# Patient Record
Sex: Male | Born: 1990
Health system: Southern US, Community
[De-identification: ages and names within clinical notes are randomized; demographics above are authoritative.]

## PROBLEM LIST (undated history)

## (undated) DIAGNOSIS — F909 Attention-deficit hyperactivity disorder, unspecified type: Secondary | ICD-10-CM

## (undated) DIAGNOSIS — K219 Gastro-esophageal reflux disease without esophagitis: Secondary | ICD-10-CM

## (undated) DIAGNOSIS — J358 Other chronic diseases of tonsils and adenoids: Secondary | ICD-10-CM

## (undated) DIAGNOSIS — J45909 Unspecified asthma, uncomplicated: Secondary | ICD-10-CM

## (undated) HISTORY — PX: WISDOM TOOTH EXTRACTION: SHX21

## (undated) HISTORY — DX: Unspecified asthma, uncomplicated: J45.909

---
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
## 2012-12-07 ENCOUNTER — Encounter: Payer: Self-pay | Admitting: Gastroenterology

## 2012-12-14 ENCOUNTER — Ambulatory Visit (INDEPENDENT_AMBULATORY_CARE_PROVIDER_SITE_OTHER): Payer: 59 | Admitting: Gastroenterology

## 2012-12-14 ENCOUNTER — Encounter: Payer: Self-pay | Admitting: Gastroenterology

## 2012-12-14 VITALS — BP 110/70 | HR 60 | Ht 68.5 in | Wt 161.8 lb

## 2012-12-14 DIAGNOSIS — R197 Diarrhea, unspecified: Secondary | ICD-10-CM

## 2012-12-14 MED ORDER — METRONIDAZOLE 250 MG PO TABS: 250.0000 mg | ORAL_TABLET | Freq: Three times a day (TID) | ORAL | Status: DC

## 2012-12-14 NOTE — Progress Notes (Signed)
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  He has increased mucus in his stool but generally no heme, abdominal pain, but does have some gas, bloating, and postprandial nausea.  He has had exposure to mountain water in December when he was camping out, and drank from a mountain stream, Temporally this was before his diarrhea started. He also has 2 [place] dogs.  He's been seen by is primary care physician,and had negative stool exams for ova and parasites and negative stool culture and normal laboratory parameters except for a slightly increased bilirubin of 1.2 mg percent.  Stool was negative for blood on rectal examination earlier this month.  There no sick family members at home, no family history of inflammatory bowel disease, and he denies gastrointestinal problems as a child.  He currently is in school at [employer] ., and denies stress or any psychiatric difficulties.  He uses alcohol socially but denies alcohol abuse, and does not smoke or use NSAIDs.  He said no major changes in his diet he has no symptoms of chronic malabsorption.  I have reviewed this patient's present history, medical and surgical past history, allergies and medications.     ROS:   All systems were reviewed and are negative unless otherwise stated in the HPI.  No Known Allergies No outpatient prescriptions prior to visit.   No facility-administered medications prior to visit.   Past Medical History  Diagnosis Date  . Asthma    Past Surgical History  Procedure Laterality Date  . Mole removal     History   Social History  . Marital Status: Single    Spouse Name: N/A    Number of Children: N/A  . Years of Education: N/A   Occupational History  . MINOR     Student [employer]   Social History Main Topics  . Smoking status: Never Smoker   . Smokeless tobacco: Never Used  .  Alcohol Use: Yes  . Drug Use: No  . Sexually Active: None   Other Topics Concern  . None   Social History Narrative  . None   Family History  Problem Relation Age of Onset  . Pancreatic cancer Maternal Grandfather   . Hypertension      Maternal side       Physical Exam: LT appearing patient in no distress.  Blood pressure 110/70, pulse 60 and regular and weight 161 with a BMI of 24.24. General well developed well nourished patient in no acute distress, appearing their stated age Eyes PERRLA, no icterus, fundoscopic exam per opthamologist Skin no lesions noted Neck supple, no adenopathy, no thyroid enlargement, no tenderness Chest clear to percussion and auscultation Heart no significant murmurs, gallops or rubs noted Abdomen no hepatosplenomegaly masses or tenderness, BS normal.  Extremities no acute joint lesions, edema, phlebitis or evidence of cellulitis. Neurologic patient oriented x 3, cranial nerves intact, no focal neurologic deficits noted. Psychological mental status normal and normal affect.  Assessment and plan: Despite a negative stool ova and parasite exam, I suspect this patient had giardiasis related to his procedure to mountain water in early December.  Giardia can be extremely difficult to detect on a single stool O&P exam.  It certainly does not sound like this patient has inflammatory bowel disease or malabsorption.  I have decided to treat him with metronidazole 250 mg 3 times a  day for 10 days with a somewhat low residue diet without major lactose products or nonabsorbable carbohydrates.  He is to call in 7-10 days time for progress report.  Further evaluation and see if he has not responded to the above treatment protocol.  Please copy her primary care physician, referring physician, and pertinent subspecialists.  No diagnosis found.

## 2012-12-14 NOTE — Patient Instructions (Signed)
  We have sent the following medications to your pharmacy for you to pick up at your convenience: Flagyl 250 mg, please take one tablet by mouth three times daily for ten days  Please call Dr. Norval Gable nurse Aram Beecham when you finish with your antibiotic to give a report on how you feel.  Information on Artificial Sweeteners given today for your review. ____________________________________________                                               We are excited to introduce MyChart, a new best-in-class service that provides you online access to important information in your electronic medical record. We want to make it easier for you to view your health information - all in one secure location - when and where you need it. We expect MyChart will enhance the quality of care and service we provide.  When you register for MyChart, you can:    View your test results.    Request appointments and receive appointment reminders via email.    Request medication renewals.    View your medical history, allergies, medications and immunizations.    Communicate with your physician's office through a password-protected site.    Conveniently print information such as your medication lists.  To find out if MyChart is right for you, please talk to a member of our clinical staff today. We will gladly answer your questions about this free health and wellness tool.  If you are age 20 or older and want a member of your family to have access to your record, you must provide written consent by completing a proxy form available at our office. Please speak to our clinical staff about guidelines regarding accounts for patients younger than age 20.  As you activate your MyChart account and need any technical assistance, please call the MyChart technical support line at [phone]) or email your question to [email or web address]. If you email your question(s), please include your name, a return  phone number and the best time to reach you.  If you have non-urgent health-related questions, you can send a message to our office through MyChart at [email or web address]. If you have a medical emergency, call 911.  Thank you for using MyChart as your new health and wellness resource!   MyChart licensed from [employer],  4540-9811. Patents Pending.

## 2013-01-03 ENCOUNTER — Telehealth: Payer: Self-pay | Admitting: Gastroenterology

## 2013-01-04 ENCOUNTER — Encounter (HOSPITAL_COMMUNITY): Payer: Self-pay | Admitting: Emergency Medicine

## 2013-01-04 ENCOUNTER — Emergency Department (HOSPITAL_COMMUNITY)
Admission: EM | Admit: 2013-01-04 | Discharge: 2013-01-04 | Disposition: A | Discharge status: Home / Self Care | Payer: 59 | Source: Home / Self Care

## 2013-01-04 DIAGNOSIS — L739 Follicular disorder, unspecified: Secondary | ICD-10-CM

## 2013-01-04 DIAGNOSIS — L738 Other specified follicular disorders: Secondary | ICD-10-CM

## 2013-01-04 MED ORDER — DOXYCYCLINE HYCLATE 100 MG PO CAPS: 100.0000 mg | ORAL_CAPSULE | Freq: Two times a day (BID) | ORAL | Status: DC

## 2013-01-04 NOTE — ED Notes (Signed)
C/o skin rash on right thigh leg.  Patient states the rash came up after he went horse back riding on last Wednesday.  Patient states he did have itching, and drainage.  Has used Bactroban.

## 2013-01-04 NOTE — Telephone Encounter (Signed)
lmom for pt to call back. 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 His call note states his diarrhea is better, but he continues to have some diarrhea and his tools have not returned to normal.

## 2013-01-04 NOTE — ED Provider Notes (Signed)
Peter Mckee is a 22 y.o. male who presents to Urgent Care today for right posterior leg rash occurring one week ago. The rash occurred one day following a horseback riding session where he developed abrasions on the back of both legs. It was initially erythematous and painful with several pustules. He has been treated with antibiotic ointment and it is resolving. He no longer notes any pain or discharge. He denies any fevers or chills and feels well otherwise. He denies any radiating pain weakness or numbness. No mass or vomiting or diarrhea.   PMH reviewed. Healthy otherwise History  Substance Use Topics  . Smoking status: Never Smoker   . Smokeless tobacco: Never Used  . Alcohol Use: Yes   ROS as above Medications reviewed. No current facility-administered medications for this encounter.   Current Outpatient Prescriptions  Medication Sig Dispense Refill  . doxycycline (VIBRAMYCIN) 100 MG capsule Take 1 capsule (100 mg total) by mouth 2 (two) times daily.  20 capsule  0  . Multiple Vitamin (MULTIVITAMIN) tablet Take 1 tablet by mouth daily.      . vitamin C (ASCORBIC ACID) 500 MG tablet Take 500 mg by mouth daily.        Exam:  BP 131/79  Pulse 60  Temp(Src) 98.5 F (36.9 C) (Oral)  Resp 18  SpO2 100% Gen: Well NAD Skin: Right posterior thigh. Scattered erythematous macules with occasional pustules in the area approximately 3 cm in diameter. Surrounding this area is small healing abrasions. He has similar abrasions on the other side.  Nontender no scale or discharge.     Assessment and Plan: 22 y.o. male with resolving folliculitis.  Currently being well treated with topical antibiotic ointment.  Plan to augment with oral doxycycline if not improving. Discussed warning signs or symptoms. Please see discharge instructions. Patient expresses understanding.      Rodolph Bong, MD 01/04/13 1739

## 2013-01-05 MED ORDER — VSL#3 PO CAPS: 2.0000 | ORAL_CAPSULE | Freq: Every day | ORAL | Status: DC

## 2013-01-05 NOTE — Telephone Encounter (Signed)
VSl #3 for 1 month

## 2013-01-05 NOTE — Telephone Encounter (Signed)
Informed pt of the new script for VSL and he will keep the med refrigerated. Pt stated understanding.

## 2013-01-05 NOTE — Telephone Encounter (Signed)
lmom that we have samples of VSL #3 for him to take x 1 month. The samples will be in the refrigerator

## 2013-01-05 NOTE — Telephone Encounter (Signed)
Pt was to call with an update 7-10 days after his OV on 12/14/12. Pt reports he finished the Flagyl and he has improvement in the diarrhea. He is going less frequently and the consistency of his stools are better. Before he was having urgency and diarrhea every time he ate, so maybe 5 stools daily. Now he has 3 or less stools daily, there is no urgency, he is eating better and his stools are less loose and more formed. Any further orders/actions? Thanks.

## 2014-10-01 DIAGNOSIS — J358 Other chronic diseases of tonsils and adenoids: Secondary | ICD-10-CM

## 2014-10-01 HISTORY — DX: Other chronic diseases of tonsils and adenoids: J35.8

## 2014-10-03 ENCOUNTER — Encounter (HOSPITAL_BASED_OUTPATIENT_CLINIC_OR_DEPARTMENT_OTHER): Payer: Self-pay | Admitting: *Deleted

## 2014-10-04 ENCOUNTER — Ambulatory Visit: Payer: Self-pay | Admitting: Otolaryngology

## 2014-10-04 NOTE — H&P (Signed)
  Assessment  Tonsillar calculus (474.8) (J35.8). Discussed  Chronic tonsil calculus with inflammation. We discussed conservative measures including gargling and cleaning out the debris. Consider tonsillectomy if all else fails. A handout was provided about tonsillectomy. Contact us as needed. Reason For Visit  Swollen tonsils. HPI  Chronic intermittent sore throats and debris buildup in both tonsils, left sides typically worse. Sometimes it bleeds also. Otherwise healthy. Allergies  No Known Drug Allergies. Current Meds  Dextroamphetamine Sulfate CR 10 MG CPCR;; RPT Adderall TABS (Amphetamine-Dextroamphetamine);; RPT. Active Problems  Asthma   (493.90) (J45.909). PSH  Oral Surgery Tooth Extraction. Family Hx  Family history of osteoporosis: Grandmother (V17.81) (Z82.62) Family history of pancreatic cancer: Grandmother (V16.0) (Z80.0) No pertinent family history: Mother. Personal Hx  Caffeine use (V49.89) (F15.90); 1 cup daily Never smoker Non-smoker (V49.89) (Z78.9) Social alcohol use (Z78.9). ROS  Systemic: Not feeling tired (fatigue).  No fever, no night sweats, and no recent weight loss. Head: No headache. Eyes: No eye symptoms. Otolaryngeal: No hearing loss, no earache, no tinnitus, and no purulent nasal discharge.  No nasal passage blockage (stuffiness), no snoring, no sneezing, and no hoarseness.  Sore throat. Cardiovascular: No chest pain or discomfort  and no palpitations. Pulmonary: No dyspnea, no cough, and no wheezing. Gastrointestinal: No dysphagia  and no heartburn.  No nausea, no abdominal pain, and no melena.  No diarrhea. Genitourinary: No dysuria. Endocrine: No muscle weakness. Musculoskeletal: No calf muscle cramps, no arthralgias, and no soft tissue swelling. Neurological: No dizziness, no fainting, no tingling, and no numbness. Psychological: No anxiety  and no depression. Skin: No rash. 12 system ROS was obtained and reviewed on the Health Maintenance  form dated today.  Positive responses are shown above.  If the symptom is not checked, the patient has denied it. Vital Signs   Recorded by Skolimowski,Sharon on 03 Oct 2014 01:16 PM BP:120/72,  Height: 5 ft 10 in, Weight: 166 lb , BMI: 23.8 kg/m2,  BMI Calculated: 23.82 ,  BSA Calculated: 1.93. Physical Exam  APPEARANCE: Well developed, well nourished, in no acute distress.  Normal affect, in a pleasant mood.  Oriented to time, place and person. COMMUNICATION: Normal voice   HEAD & FACE:  No scars, lesions or masses of head and face.  Sinuses nontender to palpation.  Salivary glands without mass or tenderness.  Facial strength symmetric.  No facial lesion, scars, or mass. EYES: EOMI with normal primary gaze alignment. Visual acuity grossly intact.  PERRLA EXTERNAL EAR & NOSE: No scars, lesions or masses  EAC & TYMPANIC MEMBRANE:  EAC shows no obstructing lesions or debris and tympanic membranes are normal bilaterally with good movement to insufflation. GROSS HEARING: Normal   TMJ:  Nontender  INTRANASAL EXAM: No polyps or purulence.  NASOPHARYNX: Normal, without lesions. LIPS, TEETH & GUMS: No lip lesions, normal dentition and normal gums. ORAL CAVITY/OROPHARYNX:  Oral mucosa moist without lesion or asymmetry of the palate, tongue, tonsil or posterior pharynx, except for some erythema of the anterior pillar on the left and the tonsils were both deeply cryptic without any identifiable debris today. NECK:  Supple without adenopathy or mass. THYROID:  Normal with no masses palpable.  NEUROLOGIC:  No gross CN deficits. No nystagmus noted.   LYMPHATIC:  No enlarged nodes palpable. Signature  Electronically signed by : Genie Mirabal  M.D.; 10/03/2014 1:26 PM EST.  

## 2014-10-06 ENCOUNTER — Ambulatory Visit (HOSPITAL_BASED_OUTPATIENT_CLINIC_OR_DEPARTMENT_OTHER): Payer: 59 | Admitting: Certified Registered"

## 2014-10-06 ENCOUNTER — Encounter (HOSPITAL_BASED_OUTPATIENT_CLINIC_OR_DEPARTMENT_OTHER): Payer: Self-pay | Admitting: Certified Registered"

## 2014-10-06 ENCOUNTER — Ambulatory Visit (HOSPITAL_BASED_OUTPATIENT_CLINIC_OR_DEPARTMENT_OTHER)
Admission: RE | Admit: 2014-10-06 | Discharge: 2014-10-06 | Disposition: A | Discharge status: Home / Self Care | Payer: 59 | Source: Ambulatory Visit | Attending: Otolaryngology | Admitting: Otolaryngology

## 2014-10-06 ENCOUNTER — Encounter (HOSPITAL_BASED_OUTPATIENT_CLINIC_OR_DEPARTMENT_OTHER)
Admission: RE | Disposition: A | Discharge status: Home / Self Care | Payer: Self-pay | Source: Ambulatory Visit | Attending: Otolaryngology

## 2014-10-06 DIAGNOSIS — J358 Other chronic diseases of tonsils and adenoids: Secondary | ICD-10-CM | POA: Diagnosis not present

## 2014-10-06 DIAGNOSIS — J45909 Unspecified asthma, uncomplicated: Secondary | ICD-10-CM | POA: Diagnosis not present

## 2014-10-06 DIAGNOSIS — J3501 Chronic tonsillitis: Secondary | ICD-10-CM | POA: Diagnosis not present

## 2014-10-06 DIAGNOSIS — Z9089 Acquired absence of other organs: Secondary | ICD-10-CM

## 2014-10-06 DIAGNOSIS — Z79899 Other long term (current) drug therapy: Secondary | ICD-10-CM | POA: Diagnosis not present

## 2014-10-06 HISTORY — DX: Attention-deficit hyperactivity disorder, unspecified type: F90.9

## 2014-10-06 HISTORY — DX: Gastro-esophageal reflux disease without esophagitis: K21.9

## 2014-10-06 HISTORY — DX: Other chronic diseases of tonsils and adenoids: J35.8

## 2014-10-06 HISTORY — PX: TONSILLECTOMY: SHX5217

## 2014-10-06 LAB — POCT HEMOGLOBIN-HEMACUE: Hemoglobin: 12.5 g/dL — ABNORMAL LOW (ref 13.0–17.0)

## 2014-10-06 SURGERY — TONSILLECTOMY
Anesthesia: General | Site: Mouth | Laterality: Bilateral

## 2014-10-06 MED ORDER — BACITRACIN ZINC 500 UNIT/GM EX OINT: 1.0000 "application " | TOPICAL_OINTMENT | Freq: Three times a day (TID) | CUTANEOUS | Status: DC

## 2014-10-06 MED ORDER — ONDANSETRON HCL 4 MG/2ML IJ SOLN: 4.0000 mg | Freq: Once | INTRAMUSCULAR | Status: DC | PRN

## 2014-10-06 MED ORDER — AMPHETAMINE-DEXTROAMPHET ER 20 MG PO CP24: 20.0000 mg | ORAL_CAPSULE | Freq: Every day | ORAL | Status: DC

## 2014-10-06 MED ORDER — ALBUTEROL SULFATE HFA 108 (90 BASE) MCG/ACT IN AERS: 1.0000 | INHALATION_SPRAY | Freq: Four times a day (QID) | RESPIRATORY_TRACT | Status: DC | PRN

## 2014-10-06 MED ORDER — FENTANYL CITRATE (PF) 100 MCG/2ML IJ SOLN
INTRAMUSCULAR | Status: DC | PRN
Start: 2014-10-06 — End: 2014-10-06
  Administered 2014-10-06 (×2): 50 ug via INTRAVENOUS
  Administered 2014-10-06: 100 ug via INTRAVENOUS

## 2014-10-06 MED ORDER — FENTANYL CITRATE (PF) 100 MCG/2ML IJ SOLN: 50.0000 ug | INTRAMUSCULAR | Status: DC | PRN

## 2014-10-06 MED ORDER — FENTANYL CITRATE (PF) 100 MCG/2ML IJ SOLN
INTRAMUSCULAR | Status: AC
  Filled 2014-10-06: qty 4

## 2014-10-06 MED ORDER — HYDROMORPHONE HCL 1 MG/ML IJ SOLN
0.2500 mg | INTRAMUSCULAR | Status: DC | PRN
  Administered 2014-10-06 (×2): 0.5 mg via INTRAVENOUS

## 2014-10-06 MED ORDER — IBUPROFEN 100 MG/5ML PO SUSP
ORAL | Status: AC
  Filled 2014-10-06: qty 20

## 2014-10-06 MED ORDER — LIDOCAINE HCL (CARDIAC) 20 MG/ML IV SOLN
INTRAVENOUS | Status: DC | PRN
  Administered 2014-10-06: 100 mg via INTRAVENOUS

## 2014-10-06 MED ORDER — MEPERIDINE HCL 25 MG/ML IJ SOLN: 6.2500 mg | INTRAMUSCULAR | Status: DC | PRN

## 2014-10-06 MED ORDER — HYDROMORPHONE HCL 1 MG/ML IJ SOLN
INTRAMUSCULAR | Status: AC
  Filled 2014-10-06: qty 1

## 2014-10-06 MED ORDER — PROPOFOL 10 MG/ML IV BOLUS
INTRAVENOUS | Status: DC | PRN
  Administered 2014-10-06: 170 mg via INTRAVENOUS

## 2014-10-06 MED ORDER — LACTATED RINGERS IV SOLN
INTRAVENOUS | Status: DC
  Administered 2014-10-06 (×2): via INTRAVENOUS

## 2014-10-06 MED ORDER — MIDAZOLAM HCL 2 MG/ML PO SYRP: 0.5000 mg/kg | ORAL_SOLUTION | Freq: Once | ORAL | Status: DC | PRN

## 2014-10-06 MED ORDER — HYDROCODONE-ACETAMINOPHEN 7.5-325 MG/15ML PO SOLN: 15.0000 mL | Freq: Four times a day (QID) | ORAL | Status: AC | PRN

## 2014-10-06 MED ORDER — AMPHETAMINE-DEXTROAMPHETAMINE 10 MG PO TABS: 10.0000 mg | ORAL_TABLET | Freq: Every day | ORAL | Status: DC

## 2014-10-06 MED ORDER — ONDANSETRON HCL 4 MG/2ML IJ SOLN
INTRAMUSCULAR | Status: DC | PRN
  Administered 2014-10-06: 4 mg via INTRAVENOUS

## 2014-10-06 MED ORDER — PROMETHAZINE HCL 25 MG RE SUPP: 25.0000 mg | Freq: Four times a day (QID) | RECTAL | Status: DC | PRN

## 2014-10-06 MED ORDER — MIDAZOLAM HCL 2 MG/2ML IJ SOLN
1.0000 mg | INTRAMUSCULAR | Status: DC | PRN
  Administered 2014-10-06: 2 mg via INTRAVENOUS

## 2014-10-06 MED ORDER — PROMETHAZINE HCL 25 MG RE SUPP: 25.0000 mg | Freq: Four times a day (QID) | RECTAL | Status: AC | PRN

## 2014-10-06 MED ORDER — MIDAZOLAM HCL 2 MG/2ML IJ SOLN
INTRAMUSCULAR | Status: AC
  Filled 2014-10-06: qty 2

## 2014-10-06 MED ORDER — GLYCOPYRROLATE 0.2 MG/ML IJ SOLN: 0.2000 mg | Freq: Once | INTRAMUSCULAR | Status: DC | PRN

## 2014-10-06 MED ORDER — IBUPROFEN 100 MG/5ML PO SUSP
400.0000 mg | Freq: Four times a day (QID) | ORAL | Status: DC | PRN
  Administered 2014-10-06: 400 mg via ORAL

## 2014-10-06 MED ORDER — PROPOFOL 10 MG/ML IV BOLUS
INTRAVENOUS | Status: AC
  Filled 2014-10-06: qty 40

## 2014-10-06 MED ORDER — BACITRACIN ZINC 500 UNIT/GM EX OINT
TOPICAL_OINTMENT | CUTANEOUS | Status: DC | PRN
  Administered 2014-10-06: 1 via TOPICAL

## 2014-10-06 MED ORDER — DEXTROSE-NACL 5-0.9 % IV SOLN
INTRAVENOUS | Status: DC
  Administered 2014-10-06: 12:00:00 via INTRAVENOUS

## 2014-10-06 MED ORDER — DEXAMETHASONE SODIUM PHOSPHATE 4 MG/ML IJ SOLN
INTRAMUSCULAR | Status: DC | PRN
  Administered 2014-10-06: 10 mg via INTRAVENOUS

## 2014-10-06 MED ORDER — SUCCINYLCHOLINE CHLORIDE 20 MG/ML IJ SOLN
INTRAMUSCULAR | Status: DC | PRN
  Administered 2014-10-06: 140 mg via INTRAVENOUS

## 2014-10-06 MED ORDER — PROMETHAZINE HCL 25 MG PO TABS: 25.0000 mg | ORAL_TABLET | Freq: Four times a day (QID) | ORAL | Status: DC | PRN

## 2014-10-06 MED ORDER — HYDROCODONE-ACETAMINOPHEN 7.5-325 MG/15ML PO SOLN
10.0000 mL | ORAL | Status: DC | PRN
  Administered 2014-10-06: 15 mL via ORAL
  Filled 2014-10-06: qty 15

## 2014-10-06 MED ORDER — PHENOL 1.4 % MT LIQD: 1.0000 | OROMUCOSAL | Status: DC | PRN

## 2014-10-06 SURGICAL SUPPLY — 26 items
CANISTER SUCT 1200ML W/VALVE (MISCELLANEOUS) ×2 IMPLANT
CATH ROBINSON RED A/P 12FR (CATHETERS) ×2 IMPLANT
COAGULATOR SUCT SWTCH 10FR 6 (ELECTROSURGICAL) ×2 IMPLANT
COVER MAYO STAND STRL (DRAPES) ×2 IMPLANT
ELECT COATED BLADE 2.86 ST (ELECTRODE) ×2 IMPLANT
ELECT REM PT RETURN 9FT ADLT (ELECTROSURGICAL) ×2
ELECT REM PT RETURN 9FT PED (ELECTROSURGICAL)
ELECTRODE REM PT RETRN 9FT PED (ELECTROSURGICAL) IMPLANT
ELECTRODE REM PT RTRN 9FT ADLT (ELECTROSURGICAL) IMPLANT
GLOVE BIOGEL M 7.0 STRL (GLOVE) ×1 IMPLANT
GLOVE ECLIPSE 7.5 STRL STRAW (GLOVE) ×2 IMPLANT
GOWN STRL REUS W/ TWL LRG LVL3 (GOWN DISPOSABLE) ×2 IMPLANT
GOWN STRL REUS W/TWL LRG LVL3 (GOWN DISPOSABLE) ×4
MARKER SKIN DUAL TIP RULER LAB (MISCELLANEOUS) ×1 IMPLANT
NS IRRIG 1000ML POUR BTL (IV SOLUTION) ×2 IMPLANT
PENCIL FOOT CONTROL (ELECTRODE) ×2 IMPLANT
SHEET MEDIUM DRAPE 40X70 STRL (DRAPES) ×2 IMPLANT
SOLUTION BUTLER CLEAR DIP (MISCELLANEOUS) IMPLANT
SPONGE GAUZE 4X4 12PLY STER LF (GAUZE/BANDAGES/DRESSINGS) ×2 IMPLANT
SPONGE TONSIL 1 RF SGL (DISPOSABLE) IMPLANT
SPONGE TONSIL 1.25 RF SGL STRG (GAUZE/BANDAGES/DRESSINGS) IMPLANT
SYR BULB 3OZ (MISCELLANEOUS) ×2 IMPLANT
TOWEL OR 17X24 6PK STRL BLUE (TOWEL DISPOSABLE) ×2 IMPLANT
TUBE CONNECTING 20X1/4 (TUBING) ×2 IMPLANT
TUBE SALEM SUMP 12R W/ARV (TUBING) IMPLANT
TUBE SALEM SUMP 16 FR W/ARV (TUBING) ×1 IMPLANT

## 2014-10-06 NOTE — Discharge Instructions (Signed)
Tonsillectomy, Care After °Refer to this sheet in the next few weeks. These instructions provide you with information on caring for yourself after your procedure. Your health care provider may also give you more specific instructions. Your treatment has been planned according to current medical practices, but problems sometimes occur. Call your health care provider if you have any problems or questions after your procedure. °WHAT TO EXPECT AFTER THE PROCEDURE °After your procedure, it is typical to have the following: °· Your tongue will be numb, and your sense of taste will be reduced. °· Your swallowing will be difficult and painful. °· Your jaw may hurt or make a clicking noise when you yawn or chew. °· Liquids that you drink may leak out of your nose. °· Your voice may sound muffled. °· The area at the middle of the roof of your mouth (uvula) may be very swollen. °· You may have a constant cough and need to clear mucus and phlegm from your throat. °HOME CARE INSTRUCTIONS  °· Get proper rest, keeping your head elevated at all times. °· Drink plenty of fluids. This reduces pain and hastens the healing process. °· Only take over-the-counter or prescription medicines for pain, discomfort, or fever as directed by your health care provider. Do not take aspirin or nonsteroidal anti-inflammatory drugs, such as ibuprofen, for 2 weeks after surgery. These medicines increase the possibility of bleeding. °· Soft and cold foods, such as gelatin, sherbet, ice cream, frozen ice pops, and cold drinks, are usually the easiest to eat. Several days after surgery, you will be able to eat more solid food. °· Avoid mouthwashes and gargles. °· Avoid contact with people who have upper respiratory infections, such as colds and sore throats. °SEEK MEDICAL CARE IF:  °· You have increasing pain that is not controlled with medicines. °· You have a fever. °· You have a rash. °· You feel lightheaded or faint. °· You are unable to swallow even  small amounts of liquid or saliva. °· Your urine is becoming very dark. °SEEK IMMEDIATE MEDICAL CARE IF:  °· You have difficulty breathing. °· You experience side effects or allergic reactions to medicines. °· You bleed bright red blood from your throat, or you vomit bright red blood. °MAKE SURE YOU: °· Understand these instructions. °· Will watch your condition. °· Will get help right away if you are not doing well or get worse. °Document Released: 03/19/2004 Document Revised: 05/24/2013 Document Reviewed: 12/14/2012 °ExitCare® Patient Information ©2015 [employer]. This information is not intended to replace advice given to you by your health care provider. Make sure you discuss any questions you have with your health care provider. ° ° °Post Anesthesia Home Care Instructions ° °Activity: °Get plenty of rest for the remainder of the day. A responsible adult should stay with you for 24 hours following the procedure.  °For the next 24 hours, DO NOT: °-Drive a car °-Operate machinery °-Drink alcoholic beverages °-Take any medication unless instructed by your physician °-Make any legal decisions or sign important papers. ° °Meals: °Start with liquid foods such as gelatin or soup. Progress to regular foods as tolerated. Avoid greasy, spicy, heavy foods. If nausea and/or vomiting occur, drink only clear liquids until the nausea and/or vomiting subsides. Call your physician if vomiting continues. ° °Special Instructions/Symptoms: °Your throat may feel dry or sore from the anesthesia or the breathing tube placed in your throat during surgery. If this causes discomfort, gargle with warm salt water. The discomfort should disappear within   24 hours. ° °If you had a scopolamine patch placed behind your ear for the management of post- operative nausea and/or vomiting: ° °1. The medication in the patch is effective for 72 hours, after which it should be removed.  Wrap patch in a tissue and discard in the trash. Wash hands  thoroughly with soap and water. °2. You may remove the patch earlier than 72 hours if you experience unpleasant side effects which may include dry mouth, dizziness or visual disturbances. °3. Avoid touching the patch. Wash your hands with soap and water after contact with the patch. °  ° °

## 2014-10-06 NOTE — Op Note (Signed)
10/06/2014  10:11 AM  PATIENT:  Peter Mckee  24 y.o. male  PRE-OPERATIVE DIAGNOSIS:  TONSILLAR CALCULUS  POST-OPERATIVE DIAGNOSIS:  TONSILLAR CALCULUS  PROCEDURE:  Procedure(s): BILATERAL TONSILLECTOMY  SURGEON:  Surgeon(s): Serena ColonelJefry Dellamae Rosamilia, MD  ANESTHESIA:   General  COUNTS: Correct   DICTATION: The patient was taken to the operating room and placed on the operating table in the supine position. Following induction of general endotracheal anesthesia, the table was turned and the patient was draped in a standard fashion. A Crowe-Davis mouthgag was inserted into the oral cavity and used to retract the tongue and mandible, then attached to the Mayo stand.  The tonsillectomy was then performed using electrocautery dissection, carefully dissecting the avascular plane between the capsule and constrictor muscles. Cautery was used for completion of hemostasis. The tonsils were discarded.  The pharynx was irrigated with saline and suctioned. An oral gastric tube was used to aspirate the contents of the stomach. The patient was then awakened from anesthesia and transferred to PACU in stable condition.   PATIENT DISPOSITION:  To PACA, stable

## 2014-10-06 NOTE — H&P (View-Only) (Signed)
  Assessment  Tonsillar calculus (474.8) (J35.8). Discussed  Chronic tonsil calculus with inflammation. We discussed conservative measures including gargling and cleaning out the debris. Consider tonsillectomy if all else fails. A handout was provided about tonsillectomy. Contact us as needed. Reason For Visit  Swollen tonsils. HPI  Chronic intermittent sore throats and debris buildup in both tonsils, left sides typically worse. Sometimes it bleeds also. Otherwise healthy. Allergies  No Known Drug Allergies. Current Meds  Dextroamphetamine Sulfate CR 10 MG CPCR;; RPT Adderall TABS (Amphetamine-Dextroamphetamine);; RPT. Active Problems  Asthma   (493.90) (J45.909). PSH  Oral Surgery Tooth Extraction. Family Hx  Family history of osteoporosis: Grandmother (V17.81) (Z82.62) Family history of pancreatic cancer: Grandmother (V16.0) (Z80.0) No pertinent family history: Mother. Personal Hx  Caffeine use (V49.89) (F15.90); 1 cup daily Never smoker Non-smoker (V49.89) (Z78.9) Social alcohol use (Z78.9). ROS  Systemic: Not feeling tired (fatigue).  No fever, no night sweats, and no recent weight loss. Head: No headache. Eyes: No eye symptoms. Otolaryngeal: No hearing loss, no earache, no tinnitus, and no purulent nasal discharge.  No nasal passage blockage (stuffiness), no snoring, no sneezing, and no hoarseness.  Sore throat. Cardiovascular: No chest pain or discomfort  and no palpitations. Pulmonary: No dyspnea, no cough, and no wheezing. Gastrointestinal: No dysphagia  and no heartburn.  No nausea, no abdominal pain, and no melena.  No diarrhea. Genitourinary: No dysuria. Endocrine: No muscle weakness. Musculoskeletal: No calf muscle cramps, no arthralgias, and no soft tissue swelling. Neurological: No dizziness, no fainting, no tingling, and no numbness. Psychological: No anxiety  and no depression. Skin: No rash. 12 system ROS was obtained and reviewed on the Health Maintenance  form dated today.  Positive responses are shown above.  If the symptom is not checked, the patient has denied it. Vital Signs   Recorded by [facility],Sharon on 03 Oct 2014 01:16 PM BP:120/72,  Height: 5 ft 10 in, Weight: 166 lb , BMI: 23.8 kg/m2,  BMI Calculated: 23.82 ,  BSA Calculated: 1.93. Physical Exam  APPEARANCE: Well developed, well nourished, in no acute distress.  Normal affect, in a pleasant mood.  Oriented to time, place and person. COMMUNICATION: Normal voice   HEAD & FACE:  No scars, lesions or masses of head and face.  Sinuses nontender to palpation.  Salivary glands without mass or tenderness.  Facial strength symmetric.  No facial lesion, scars, or mass. EYES: EOMI with normal primary gaze alignment. Visual acuity grossly intact.  PERRLA EXTERNAL EAR & NOSE: No scars, lesions or masses  EAC & TYMPANIC MEMBRANE:  EAC shows no obstructing lesions or debris and tympanic membranes are normal bilaterally with good movement to insufflation. GROSS HEARING: Normal   TMJ:  Nontender  INTRANASAL EXAM: No polyps or purulence.  NASOPHARYNX: Normal, without lesions. LIPS, TEETH & GUMS: No lip lesions, normal dentition and normal gums. ORAL CAVITY/OROPHARYNX:  Oral mucosa moist without lesion or asymmetry of the palate, tongue, tonsil or posterior pharynx, except for some erythema of the anterior pillar on the left and the tonsils were both deeply cryptic without any identifiable debris today. NECK:  Supple without adenopathy or mass. THYROID:  Normal with no masses palpable.  NEUROLOGIC:  No gross CN deficits. No nystagmus noted.   LYMPHATIC:  No enlarged nodes palpable. Signature  Electronically signed by : Serena ColonelJefry  Valdemar Mcclenahan  M.D.; 10/03/2014 1:26 PM EST.

## 2014-10-06 NOTE — Anesthesia Procedure Notes (Signed)
Procedure Name: Intubation Date/Time: 10/06/2014 9:48 AM Performed by: Curly ShoresRAFT, Lynnsie Linders W Pre-anesthesia Checklist: Patient identified, Emergency Drugs available, Suction available, Patient being monitored and Timeout performed Patient Re-evaluated:Patient Re-evaluated prior to inductionOxygen Delivery Method: Circle System Utilized Preoxygenation: Pre-oxygenation with 100% oxygen Intubation Type: IV induction Ventilation: Mask ventilation without difficulty Laryngoscope Size: Miller and 2 Tube type: Oral Tube size: 7.0 mm Number of attempts: 1 Airway Equipment and Method: Stylet and Oral airway Placement Confirmation: ETT inserted through vocal cords under direct vision,  positive ETCO2 and breath sounds checked- equal and bilateral Secured at: 22 cm Tube secured with: Tape Dental Injury: Teeth and Oropharynx as per pre-operative assessment

## 2014-10-06 NOTE — Anesthesia Postprocedure Evaluation (Signed)
Anesthesia Post Note  Patient: Peter Mckee  Procedure(s) Performed: Procedure(s) (LRB): BILATERAL TONSILLECTOMY (Bilateral)  Anesthesia type: general  Patient location: PACU  Post pain: Pain level controlled  Post assessment: Patient's Cardiovascular Status Stable  Last Vitals:  Filed Vitals:   10/06/14 1130  BP: 141/98  Pulse: 62  Temp: 36.1 C  Resp: 12    Post vital signs: Reviewed and stable  Level of consciousness: awake  Complications: No apparent anesthesia complications

## 2014-10-06 NOTE — Transfer of Care (Signed)
Immediate Anesthesia Transfer of Care Note  Patient: Peter Mckee  Procedure(s) Performed: Procedure(s): BILATERAL TONSILLECTOMY (Bilateral)  Patient Location: PACU  Anesthesia Type:General  Level of Consciousness: awake, alert  and oriented  Airway & Oxygen Therapy: Patient Spontanous Breathing and Patient connected to face mask oxygen  Post-op Assessment: Report given to RN, Post -op Vital signs reviewed and stable and Patient moving all extremities  Post vital signs: Reviewed and stable  Last Vitals:  Filed Vitals:   10/06/14 0907  BP: 138/63  Pulse: 62  Temp: 36.7 C  Resp: 20    Complications: No apparent anesthesia complications

## 2014-10-06 NOTE — Interval H&P Note (Signed)
History and Physical Interval Note:  10/06/2014 9:24 AM  Peter Mckee  has presented today for surgery, with the diagnosis of TONSILLAR CALCULUS  The various methods of treatment have been discussed with the patient and family. After consideration of risks, benefits and other options for treatment, the patient has consented to  Procedure(s): BILATERAL TONSILLECTOMY (Bilateral) as a surgical intervention .  The patient's history has been reviewed, patient examined, no change in status, stable for surgery.  I have reviewed the patient's chart and labs.  Questions were answered to the patient's satisfaction.     Lexton Hidalgo

## 2014-10-06 NOTE — Anesthesia Preprocedure Evaluation (Signed)
Anesthesia Evaluation  Patient identified by MRN, date of birth, ID band Patient awake    Reviewed: Allergy & Precautions, NPO status , Patient's Chart, lab work & pertinent test results  Airway Mallampati: I  TM Distance: >3 FB Neck ROM: Full    Dental   Pulmonary asthma ,    Pulmonary exam normal        Cardiovascular Normal cardiovascular exam     Neuro/Psych    GI/Hepatic   Endo/Other    Renal/GU      Musculoskeletal   Abdominal   Peds  Hematology   Anesthesia Other Findings   Reproductive/Obstetrics                             Anesthesia Physical Anesthesia Plan  ASA: II  Anesthesia Plan: General   Post-op Pain Management:    Induction: Intravenous  Airway Management Planned: Oral ETT  Additional Equipment:   Intra-op Plan:   Post-operative Plan: Extubation in OR  Informed Consent: I have reviewed the patients History and Physical, chart, labs and discussed the procedure including the risks, benefits and alternatives for the proposed anesthesia with the patient or authorized representative who has indicated his/her understanding and acceptance.     Plan Discussed with: CRNA and Surgeon  Anesthesia Plan Comments:         Anesthesia Quick Evaluation  

## 2014-10-10 ENCOUNTER — Encounter (HOSPITAL_BASED_OUTPATIENT_CLINIC_OR_DEPARTMENT_OTHER): Payer: Self-pay | Admitting: Otolaryngology

## 2014-10-12 ENCOUNTER — Ambulatory Visit (HOSPITAL_COMMUNITY)
Admission: AD | Admit: 2014-10-12 | Discharge: 2014-10-12 | Disposition: A | Discharge status: Home / Self Care | Payer: 59 | Source: Ambulatory Visit | Attending: Otolaryngology | Admitting: Otolaryngology

## 2014-10-12 ENCOUNTER — Inpatient Hospital Stay (HOSPITAL_COMMUNITY): Payer: 59 | Admitting: Anesthesiology

## 2014-10-12 ENCOUNTER — Encounter (HOSPITAL_COMMUNITY): Payer: Self-pay | Admitting: Anesthesiology

## 2014-10-12 ENCOUNTER — Encounter (HOSPITAL_COMMUNITY)
Admission: AD | Disposition: A | Discharge status: Home / Self Care | Payer: Self-pay | Source: Ambulatory Visit | Attending: Otolaryngology

## 2014-10-12 ENCOUNTER — Ambulatory Visit: Payer: Self-pay | Admitting: Otolaryngology

## 2014-10-12 DIAGNOSIS — Z79899 Other long term (current) drug therapy: Secondary | ICD-10-CM | POA: Diagnosis not present

## 2014-10-12 DIAGNOSIS — Z79891 Long term (current) use of opiate analgesic: Secondary | ICD-10-CM | POA: Insufficient documentation

## 2014-10-12 DIAGNOSIS — K91841 Postprocedural hemorrhage and hematoma of a digestive system organ or structure following other procedure: Secondary | ICD-10-CM | POA: Insufficient documentation

## 2014-10-12 DIAGNOSIS — Z791 Long term (current) use of non-steroidal anti-inflammatories (NSAID): Secondary | ICD-10-CM | POA: Diagnosis not present

## 2014-10-12 DIAGNOSIS — J45909 Unspecified asthma, uncomplicated: Secondary | ICD-10-CM | POA: Diagnosis not present

## 2014-10-12 DIAGNOSIS — Y838 Other surgical procedures as the cause of abnormal reaction of the patient, or of later complication, without mention of misadventure at the time of the procedure: Secondary | ICD-10-CM | POA: Insufficient documentation

## 2014-10-12 DIAGNOSIS — K219 Gastro-esophageal reflux disease without esophagitis: Secondary | ICD-10-CM | POA: Insufficient documentation

## 2014-10-12 DIAGNOSIS — F909 Attention-deficit hyperactivity disorder, unspecified type: Secondary | ICD-10-CM | POA: Diagnosis not present

## 2014-10-12 DIAGNOSIS — Y929 Unspecified place or not applicable: Secondary | ICD-10-CM | POA: Insufficient documentation

## 2014-10-12 HISTORY — PX: WOUND EXPLORATION: SHX6188

## 2014-10-12 LAB — POCT I-STAT 4, (NA,K, GLUC, HGB,HCT)
Glucose, Bld: 107 mg/dL — ABNORMAL HIGH (ref 65–99)
HEMATOCRIT: 41 % (ref 39.0–52.0)
HEMOGLOBIN: 13.9 g/dL (ref 13.0–17.0)
Potassium: 4 mmol/L (ref 3.5–5.1)
Sodium: 139 mmol/L (ref 135–145)

## 2014-10-12 SURGERY — WOUND EXPLORATION
Anesthesia: General | Site: Throat

## 2014-10-12 MED ORDER — ONDANSETRON HCL 4 MG/2ML IJ SOLN: 4.0000 mg | Freq: Once | INTRAMUSCULAR | Status: DC | PRN

## 2014-10-12 MED ORDER — ONDANSETRON HCL 4 MG/2ML IJ SOLN
INTRAMUSCULAR | Status: DC | PRN
  Administered 2014-10-12: 4 mg via INTRAVENOUS

## 2014-10-12 MED ORDER — LACTATED RINGERS IV SOLN
INTRAVENOUS | Status: DC | PRN
  Administered 2014-10-12 (×2): via INTRAVENOUS

## 2014-10-12 MED ORDER — HYDROMORPHONE HCL 1 MG/ML IJ SOLN: 0.5000 mg | INTRAMUSCULAR | Status: DC | PRN

## 2014-10-12 MED ORDER — PROPOFOL 10 MG/ML IV BOLUS
INTRAVENOUS | Status: AC
  Filled 2014-10-12: qty 20

## 2014-10-12 MED ORDER — LACTATED RINGERS IV SOLN
INTRAVENOUS | Status: DC
  Administered 2014-10-12: 14:00:00 via INTRAVENOUS

## 2014-10-12 MED ORDER — PROPOFOL 10 MG/ML IV BOLUS
INTRAVENOUS | Status: DC | PRN
  Administered 2014-10-12: 200 mg via INTRAVENOUS

## 2014-10-12 MED ORDER — FENTANYL CITRATE (PF) 250 MCG/5ML IJ SOLN
INTRAMUSCULAR | Status: AC
Start: 2014-10-12 — End: 2014-10-12
  Filled 2014-10-12: qty 5

## 2014-10-12 MED ORDER — ONDANSETRON HCL 4 MG/2ML IJ SOLN
INTRAMUSCULAR | Status: AC
Start: 2014-10-12 — End: 2014-10-12
  Filled 2014-10-12: qty 2

## 2014-10-12 MED ORDER — FENTANYL CITRATE (PF) 100 MCG/2ML IJ SOLN
INTRAMUSCULAR | Status: DC | PRN
  Administered 2014-10-12: 125 ug via INTRAVENOUS

## 2014-10-12 MED ORDER — LIDOCAINE HCL (CARDIAC) 20 MG/ML IV SOLN
INTRAVENOUS | Status: DC | PRN
  Administered 2014-10-12: 100 mg via INTRAVENOUS

## 2014-10-12 MED ORDER — 0.9 % SODIUM CHLORIDE (POUR BTL) OPTIME
TOPICAL | Status: DC | PRN
  Administered 2014-10-12: 1000 mL

## 2014-10-12 MED ORDER — LIDOCAINE HCL (CARDIAC) 20 MG/ML IV SOLN
INTRAVENOUS | Status: AC
  Filled 2014-10-12: qty 5

## 2014-10-12 SURGICAL SUPPLY — 40 items
BLADE SURG 15 STRL LF DISP TIS (BLADE) IMPLANT
BLADE SURG 15 STRL SS (BLADE)
CANISTER SUCTION 2500CC (MISCELLANEOUS) ×4 IMPLANT
CATH ROBINSON RED A/P 10FR (CATHETERS) ×4 IMPLANT
CLEANER TIP ELECTROSURG 2X2 (MISCELLANEOUS) ×4 IMPLANT
COAGULATOR SUCT 6 FR SWTCH (ELECTROSURGICAL) ×1
COAGULATOR SUCT SWTCH 10FR 6 (ELECTROSURGICAL) ×3 IMPLANT
DRAPE PROXIMA HALF (DRAPES) IMPLANT
ELECT COATED BLADE 2.86 ST (ELECTRODE) ×4 IMPLANT
ELECT REM PT RETURN 9FT ADLT (ELECTROSURGICAL)
ELECT REM PT RETURN 9FT PED (ELECTROSURGICAL)
ELECTRODE REM PT RETRN 9FT PED (ELECTROSURGICAL) IMPLANT
ELECTRODE REM PT RTRN 9FT ADLT (ELECTROSURGICAL) IMPLANT
GAUZE SPONGE 4X4 16PLY XRAY LF (GAUZE/BANDAGES/DRESSINGS) ×7 IMPLANT
GLOVE BIOGEL PI IND STRL 7.0 (GLOVE) ×1 IMPLANT
GLOVE BIOGEL PI INDICATOR 7.0 (GLOVE) ×2
GLOVE ECLIPSE 6.5 STRL STRAW (GLOVE) ×3 IMPLANT
GLOVE ECLIPSE 7.5 STRL STRAW (GLOVE) ×4 IMPLANT
GOWN STRL REUS W/ TWL LRG LVL3 (GOWN DISPOSABLE) ×4 IMPLANT
GOWN STRL REUS W/TWL LRG LVL3 (GOWN DISPOSABLE) ×8
KIT BASIN OR (CUSTOM PROCEDURE TRAY) ×4 IMPLANT
KIT ROOM TURNOVER OR (KITS) ×4 IMPLANT
NDL HYPO 25GX1X1/2 BEV (NEEDLE) IMPLANT
NEEDLE HYPO 25GX1X1/2 BEV (NEEDLE) IMPLANT
NS IRRIG 1000ML POUR BTL (IV SOLUTION) ×4 IMPLANT
PACK GENERAL/GYN (CUSTOM PROCEDURE TRAY) ×3 IMPLANT
PACK SURGICAL SETUP 50X90 (CUSTOM PROCEDURE TRAY) ×1 IMPLANT
PAD ARMBOARD 7.5X6 YLW CONV (MISCELLANEOUS) ×8 IMPLANT
PENCIL FOOT CONTROL (ELECTRODE) ×4 IMPLANT
SPECIMEN JAR SMALL (MISCELLANEOUS) ×8 IMPLANT
SPONGE TONSIL 1 RF SGL (DISPOSABLE) ×4 IMPLANT
SYR BULB 3OZ (MISCELLANEOUS) ×4 IMPLANT
TOWEL OR 17X24 6PK STRL BLUE (TOWEL DISPOSABLE) ×8 IMPLANT
TUBE CONNECTING 12'X1/4 (SUCTIONS) ×1
TUBE CONNECTING 12X1/4 (SUCTIONS) ×3 IMPLANT
TUBE SALEM SUMP 10F W/ARV (TUBING) ×3 IMPLANT
TUBE SALEM SUMP 12R W/ARV (TUBING) IMPLANT
TUBE SALEM SUMP 14F W/ARV (TUBING) IMPLANT
TUBE SALEM SUMP 16 FR W/ARV (TUBING) IMPLANT
WATER STERILE IRR 1000ML POUR (IV SOLUTION) ×1 IMPLANT

## 2014-10-12 NOTE — Op Note (Signed)
OPERATIVE REPORT  DATE OF SURGERY: 10/12/2014  PATIENT:  Peter Mckee,  24 y.o. male  PRE-OPERATIVE DIAGNOSIS:  bleeding tonsil  POST-OPERATIVE DIAGNOSIS:  bleeding tonsil  PROCEDURE:  Procedure(s): WOUND EXPLORATION BLEEDING TONSIL  SURGEON:  Susy FrizzleJefry H Gotti Alwin, MD  ASSISTANTS: none  ANESTHESIA:   General   EBL:  10 ml  DRAINS: None  LOCAL MEDICATIONS USED:  None  SPECIMEN:  none  COUNTS:  Correct  PROCEDURE DETAILS: The patient was taken to the operating room and placed on the operating table in the supine position. Following induction of general endotracheal anesthesia, the table was turned 90 and a Crowe-Davis mouthgag was inserted into the oral cavity used to retract the tongue and mandible attached the Mayo stand. Inspection of the pharynx revealed blood clot adhering to the left tonsillar fossa. This was removed and a bleeding site was identified about two thirds the way down along the anterior aspect of the left tonsillar fossa. This was cauterized with suction cautery. There is no further bleeding. Nothing else was healing nicely. The pharynx was irrigated with saline and suctioned. An oral gastric tube was used to aspirate contents of the stomach including old blood. Saline irrigation was used as well through the irrigation port. The nasogastric tube was removed, the mouth gag was released. There is no further bleeding. Patient was awakened extubated and transferred to recovery in stable condition.    PATIENT DISPOSITION:  To PACU, stable

## 2014-10-12 NOTE — H&P (Signed)
  Peter Mckee is an 24 y.o. male.   Chief Complaint: Postop bleeding HPI: One week post tonsillectomy, with intermittent bleeding for 4 days.  Past Medical History  Diagnosis Date  . ADHD (attention deficit hyperactivity disorder)   . Asthma     prn inhaler  . Tonsillar calculus 10/2014  . Acid reflux     occasional - TUMS as needed    Past Surgical History  Procedure Laterality Date  . Wisdom tooth extraction    . Tonsillectomy Bilateral 10/06/2014    Procedure: BILATERAL TONSILLECTOMY;  Surgeon: Serena ColonelJefry Melana Hingle, MD;  Location: [facility];  Service: ENT;  Laterality: Bilateral;    Family History  Problem Relation Age of Onset  . Pancreatic cancer Maternal Grandfather    Social History:  reports that he has never smoked. He has never used smokeless tobacco. He reports that he drinks alcohol. He reports that he does not use illicit drugs.  Allergies: No Known Allergies  Medications Prior to Admission  Medication Sig Dispense Refill  . acetaminophen (TYLENOL) 325 MG tablet Take 650 mg by mouth every 6 (six) hours as needed.    Marland Kitchen. albuterol (PROVENTIL HFA;VENTOLIN HFA) 108 (90 BASE) MCG/ACT inhaler Inhale into the lungs every 6 (six) hours as needed for wheezing or shortness of breath.    . amphetamine-dextroamphetamine (ADDERALL XR) 20 MG 24 hr capsule Take 20 mg by mouth daily.    Marland Kitchen. amphetamine-dextroamphetamine (ADDERALL) 10 MG tablet Take 10 mg by mouth daily with breakfast.    . HYDROcodone-acetaminophen (HYCET) 7.5-325 mg/15 ml solution Take 15 mLs by mouth 4 (four) times daily as needed for moderate pain. 480 mL 0  . Multiple Vitamin (MULTIVITAMIN) tablet Take 1 tablet by mouth daily.    . naproxen sodium (ANAPROX) 220 MG tablet Take 220 mg by mouth 2 (two) times daily with a meal.    . promethazine (PHENERGAN) 25 MG suppository Place 1 suppository (25 mg total) rectally every 6 (six) hours as needed for nausea or vomiting. 12 suppository 1    No results found for  this or any previous visit (from the past 48 hour(s)). No results found.  ROS: otherwise negative  Blood pressure 128/91, pulse 103, temperature 97.9 F (36.6 C), temperature source Oral, resp. rate 16, height 5\' 9"  (1.753 m), weight 73.936 kg (163 lb), SpO2 97 %.  PHYSICAL EXAM: Overall appearance:  Somewhat pale and diaphoretic. Spitting out blood. Head:  Normocephalic, atraumatic. Ears: External ears normal. Nose: External nose is healthy in appearance. Internal nasal exam free of any lesions or obstruction. Oral Cavity/pharynx:  Blood clot adhering to the left tonsillar fossa with active bleeding intermittently. Neuro:  No identifiable neurologic deficits. Neck: No palpable neck masses.  Studies Reviewed: none    Assessment/Plan Intractable postop bleeding from tonsillectomy, bring to the operating room for cauterization.  Manasi Dishon 10/12/2014, 2:13 PM

## 2014-10-12 NOTE — Anesthesia Postprocedure Evaluation (Signed)
  Anesthesia Post-op Note  Patient: Peter Mckee  Procedure(s) Performed: Procedure(s): EXPLORATION AND TREATMENT OF POST TONSILLECTOMY BLEED  Patient Location: PACU  Anesthesia Type:General  Level of Consciousness: awake and alert   Airway and Oxygen Therapy: Patient Spontanous Breathing  Post-op Pain: none  Post-op Assessment: Post-op Vital signs reviewed  Post-op Vital Signs: Reviewed  Last Vitals:  Filed Vitals:   10/12/14 1604  BP: 126/78  Pulse: 87  Temp:   Resp:     Complications: No apparent anesthesia complications

## 2014-10-12 NOTE — Anesthesia Preprocedure Evaluation (Signed)
Anesthesia Evaluation  Patient identified by MRN, date of birth, ID band Patient awake    Reviewed: Allergy & Precautions, NPO status , Patient's Chart, lab work & pertinent test results  Airway Mallampati: I       Dental   Pulmonary asthma ,    Pulmonary exam normal       Cardiovascular Normal cardiovascular examRhythm:Regular     Neuro/Psych    GI/Hepatic   Endo/Other    Renal/GU      Musculoskeletal   Abdominal   Peds  Hematology   Anesthesia Other Findings Bleeding tonsil  Reproductive/Obstetrics                             Anesthesia Physical Anesthesia Plan  ASA: II  Anesthesia Plan: General   Post-op Pain Management:    Induction: Intravenous  Airway Management Planned: Oral ETT  Additional Equipment:   Intra-op Plan:   Post-operative Plan: Extubation in OR  Informed Consent: I have reviewed the patients History and Physical, chart, labs and discussed the procedure including the risks, benefits and alternatives for the proposed anesthesia with the patient or authorized representative who has indicated his/her understanding and acceptance.     Plan Discussed with: CRNA, Anesthesiologist and Surgeon  Anesthesia Plan Comments:         Anesthesia Quick Evaluation

## 2014-10-12 NOTE — Anesthesia Procedure Notes (Signed)
Procedure Name: Intubation Date/Time: 10/12/2014 2:27 PM Performed by: Izora Gala Patient Re-evaluated:Patient Re-evaluated prior to inductionOxygen Delivery Method: Circle system utilized Preoxygenation: Pre-oxygenation with 100% oxygen Intubation Type: IV induction Ventilation: Mask ventilation without difficulty Laryngoscope Size: Miller and 3 Grade View: Grade I Tube type: Oral Tube size: 7.5 mm Number of attempts: 1 Airway Equipment and Method: Stylet and LTA kit utilized Placement Confirmation: ETT inserted through vocal cords under direct vision,  positive ETCO2 and breath sounds checked- equal and bilateral Secured at: 22 cm Tube secured with: Tape Dental Injury: Teeth and Oropharynx as per pre-operative assessment

## 2014-10-12 NOTE — Transfer of Care (Signed)
Immediate Anesthesia Transfer of Care Note  Patient: Peter Mckee  Procedure(s) Performed: Procedure(s): EXPLORATION AND TREATMENT OF POST TONSILLECTOMY BLEED  Patient Location: PACU  Anesthesia Type:General  Level of Consciousness: awake, alert , oriented and patient cooperative  Airway & Oxygen Therapy: Patient Spontanous Breathing and Patient connected to nasal cannula oxygen  Post-op Assessment: Report given to RN, Post -op Vital signs reviewed and stable and Patient moving all extremities  Post vital signs: Reviewed and stable  Last Vitals:  Filed Vitals:   10/12/14 1456  BP: 128/68  Pulse: 88  Temp: 36.4 C  Resp: 15    Complications: No apparent anesthesia complications

## 2014-10-12 NOTE — Progress Notes (Signed)
Pt taken back emergently, preop checklist unable to be completed.

## 2014-10-16 ENCOUNTER — Encounter (HOSPITAL_COMMUNITY): Payer: Self-pay | Admitting: Otolaryngology

## 2014-10-16 NOTE — Discharge Summary (Signed)
  Physician Discharge Summary  Patient ID: Peter BonineKyle D Kyllo MRN: [id number] DOB/AGE: 24-Jul-1990 24 y.o.  Admit date: 10/12/2014 Discharge date: 10/16/2014  Admission Diagnoses:Post op tonsillectomy bleed  Discharge Diagnoses:  Active Problems:   * No active hospital problems. *   Discharged Condition: good  Hospital Course: improved after surgery  Consults: none  Significant Diagnostic Studies: none  Treatments: surgery: Treatment post op bleed  Discharge Exam: Blood pressure 126/78, pulse 87, temperature 98.1 F (36.7 C), temperature source Oral, resp. rate 10, height 5\' 9"  (1.753 m), weight 73.936 kg (163 lb), SpO2 98 %. PHYSICAL EXAM: No further bleeding  Disposition: 01-Home or Self Care  Discharge Instructions    Diet - low sodium heart healthy    Complete by:  As directed      Increase activity slowly    Complete by:  As directed             Medication List    TAKE these medications        acetaminophen 325 MG tablet  Commonly known as:  TYLENOL  Take 650 mg by mouth every 6 (six) hours as needed.     albuterol 108 (90 BASE) MCG/ACT inhaler  Commonly known as:  PROVENTIL HFA;VENTOLIN HFA  Inhale into the lungs every 6 (six) hours as needed for wheezing or shortness of breath.     amphetamine-dextroamphetamine 20 MG 24 hr capsule  Commonly known as:  ADDERALL XR  Take 20 mg by mouth daily.     amphetamine-dextroamphetamine 10 MG tablet  Commonly known as:  ADDERALL  Take 10 mg by mouth daily with breakfast.     HYDROcodone-acetaminophen 7.5-325 mg/15 ml solution  Commonly known as:  HYCET  Take 15 mLs by mouth 4 (four) times daily as needed for moderate pain.     multivitamin tablet  Take 1 tablet by mouth daily.     naproxen sodium 220 MG tablet  Commonly known as:  ANAPROX  Take 220 mg by mouth 2 (two) times daily with a meal.     promethazine 25 MG suppository  Commonly known as:  PHENERGAN  Place 1 suppository (25 mg total) rectally every 6  (six) hours as needed for nausea or vomiting.           Follow-up Information    Follow up with Serena ColonelOSEN, Beckhem Isadore, MD. Schedule an appointment as soon as possible for a visit in 1 week.   Specialty:  Otolaryngology   Contact information:   [address] [place] [address] [phone]       Signed: Serena ColonelROSEN, Kalvin Buss 10/16/2014, 9:54 AM

## 2015-08-27 MED FILL — DEXTROAMP-AMPHET ER 25 MG C: 25 | 90 days supply | Qty: 90 | Fill #0

## 2015-08-27 MED FILL — DEXTROAMPHETAMINE 5 MG TAB: 5 | 25 days supply | Qty: 300 | Fill #0

## 2015-09-28 DIAGNOSIS — F10120 Alcohol abuse with intoxication, uncomplicated: Secondary | ICD-10-CM | POA: Diagnosis not present

## 2015-09-28 DIAGNOSIS — R400 Somnolence: Secondary | ICD-10-CM | POA: Diagnosis not present

## 2015-10-10 DIAGNOSIS — Z0184 Encounter for antibody response examination: Secondary | ICD-10-CM | POA: Diagnosis not present

## 2015-10-11 DIAGNOSIS — Z0184 Encounter for antibody response examination: Secondary | ICD-10-CM | POA: Diagnosis not present

## 2015-10-15 DIAGNOSIS — Z23 Encounter for immunization: Secondary | ICD-10-CM | POA: Diagnosis not present

## 2015-11-12 DIAGNOSIS — Z0184 Encounter for antibody response examination: Secondary | ICD-10-CM | POA: Diagnosis not present

## 2015-11-29 MED FILL — DEXTROAMP-AMPHET ER 25 MG C: 25 | 90 days supply | Qty: 90 | Fill #0

## 2015-11-29 MED FILL — DEXTROAMPHETAMINE 5 MG TAB: 5 | 90 days supply | Qty: 300 | Fill #0

## 2017-12-08 MED FILL — CHLORHEXIDINE 0.12% RINSE: 0.12 | 16 days supply | Qty: 473 | Fill #0

## 2017-12-08 MED FILL — AMOXICILLIN 500 MG CAPSULE: 500 | 7 days supply | Qty: 21 | Fill #0

## 2018-05-21 MED FILL — CHLORHEXIDINE 0.12% RINSE: 0.12 | 15 days supply | Qty: 473 | Fill #0

## 2018-05-21 MED FILL — AMOXICILLIN 500 MG CAPSULE: 500 | 7 days supply | Qty: 21 | Fill #0
